# Patient Record
Sex: Female | Born: 1970 | Race: White | Hispanic: No | Smoking: Never smoker
Health system: Southern US, Community
[De-identification: ages and names within clinical notes are randomized; demographics above are authoritative.]

---

## 1998-03-05 ENCOUNTER — Other Ambulatory Visit: Admission: RE | Admit: 1998-03-05 | Discharge: 1998-03-05 | Payer: Self-pay | Admitting: Obstetrics and Gynecology

## 1999-01-14 ENCOUNTER — Emergency Department (HOSPITAL_COMMUNITY): Admission: EM | Admit: 1999-01-14 | Discharge: 1999-01-14 | Payer: Self-pay | Admitting: Emergency Medicine

## 2000-10-02 ENCOUNTER — Emergency Department (HOSPITAL_COMMUNITY): Admission: EM | Admit: 2000-10-02 | Discharge: 2000-10-02 | Payer: Self-pay | Admitting: Emergency Medicine

## 2000-10-02 ENCOUNTER — Encounter: Payer: Self-pay | Admitting: Emergency Medicine

## 2005-02-19 ENCOUNTER — Emergency Department (HOSPITAL_COMMUNITY): Admission: EM | Admit: 2005-02-19 | Discharge: 2005-02-20 | Payer: Self-pay | Admitting: Emergency Medicine

## 2005-02-23 ENCOUNTER — Emergency Department (HOSPITAL_COMMUNITY): Admission: EM | Admit: 2005-02-23 | Discharge: 2005-02-23 | Payer: Self-pay | Admitting: Emergency Medicine

## 2005-05-11 ENCOUNTER — Encounter: Admission: RE | Admit: 2005-05-11 | Discharge: 2005-06-30 | Payer: Self-pay | Admitting: Orthopedic Surgery

## 2007-01-16 ENCOUNTER — Ambulatory Visit (HOSPITAL_COMMUNITY): Admission: RE | Admit: 2007-01-16 | Discharge: 2007-01-16 | Payer: Self-pay | Admitting: Orthopedic Surgery

## 2010-09-27 ENCOUNTER — Emergency Department (HOSPITAL_COMMUNITY)
Admission: EM | Admit: 2010-09-27 | Discharge: 2010-09-28 | Payer: Self-pay | Source: Home / Self Care | Admitting: Emergency Medicine

## 2011-01-03 LAB — BASIC METABOLIC PANEL
Calcium: 9.3 mg/dL (ref 8.4–10.5)
GFR calc Af Amer: >60 mL/min (ref >60)
Potassium: 3.5 mEq/L (ref 3.5–5.1)
Sodium: 139 mEq/L (ref 135–145)

## 2011-01-03 LAB — POCT CARDIAC MARKERS
CKMB, poc: <1 ng/mL — ABNORMAL LOW (ref 1.0–8.0)
Myoglobin, poc: 34.1 ng/mL (ref 12–200)

## 2011-01-03 LAB — DIFFERENTIAL
Eosinophils Relative: 0 % (ref 0–5)
Lymphocytes Relative: 16 % (ref 12–46)
Monocytes Relative: 2 % — ABNORMAL LOW (ref 3–12)
Neutro Abs: 7.3 10*3/uL (ref 1.7–7.7)

## 2011-01-03 LAB — STREP A DNA PROBE

## 2011-01-03 LAB — CBC
Hemoglobin: 12.8 g/dL (ref 12.0–15.0)
MCHC: 35 g/dL (ref 30.0–36.0)
MCV: 90.8 fL (ref 78.0–100.0)
Platelets: 296 10*3/uL (ref 150–400)
RDW: 12.1 % (ref 11.5–15.5)

## 2011-01-03 LAB — URINALYSIS, ROUTINE W REFLEX MICROSCOPIC
Bilirubin Urine: NEGATIVE
Glucose, UA: NEGATIVE mg/dL
Hgb urine dipstick: NEGATIVE
Specific Gravity, Urine: 1.006 (ref 1.005–1.030)
pH: 8.5 — ABNORMAL HIGH (ref 5.0–8.0)

## 2011-01-03 LAB — MONONUCLEOSIS SCREEN: Mono Screen: NEGATIVE

## 2011-01-03 LAB — D-DIMER, QUANTITATIVE: D-Dimer, Quant: 0.53 ug/mL-FEU — ABNORMAL HIGH (ref 0.00–0.48)

## 2011-01-03 LAB — RAPID STREP SCREEN (MED CTR MEBANE ONLY): Streptococcus, Group A Screen (Direct): NEGATIVE

## 2011-09-17 IMAGING — CT CT ANGIO CHEST
2 of 6 series · 19 of 36 positions shown · IV contrast (APPLIED)
Comparison: None

CLINICAL DATA: Shortness of breath

CT ANGIOGRAPHY CHEST WITH CONTRAST
TECHNIQUE: Multidetector CT imaging of the chest was performed
using the standard protocol during bolus administration of
intravenous contrast.  Multiplanar CT image reconstructions
including MIPs were obtained to evaluate the vascular anatomy.
Contrast:  100 ml [id number]

[Series 8: pulm embolism 1.0 b25f thins · axial · 0.59mm/px · z∈[+1174,+1413]mm · 18 of 267 slices shown]
[im 14/267  lung]
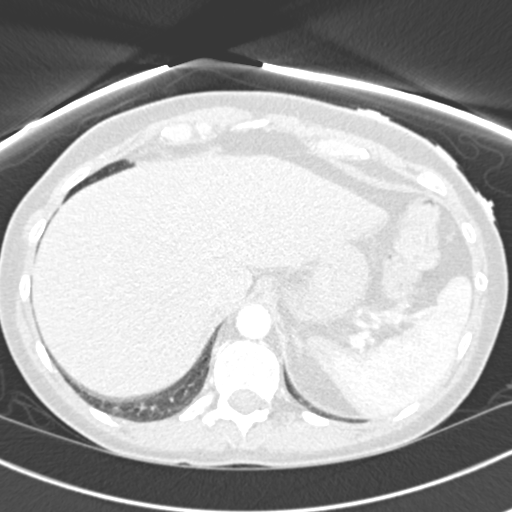
[im 27/267  mediastinal]
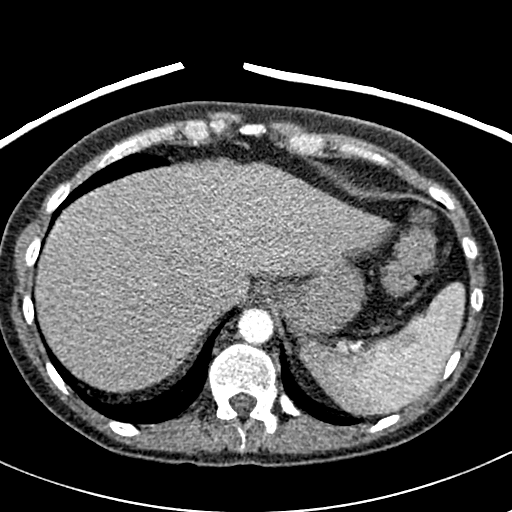
[im 40/267  lung]
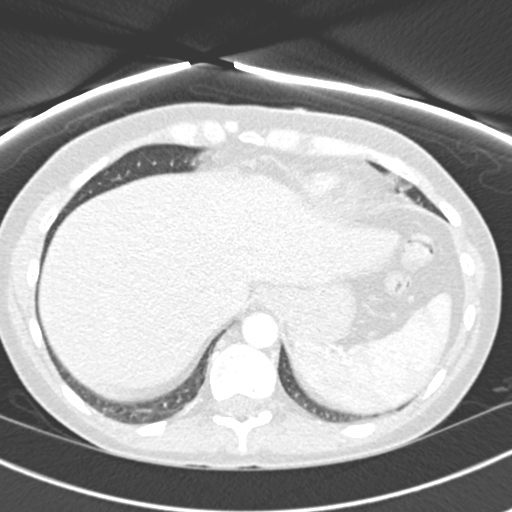
[im 54/267  mediastinal]
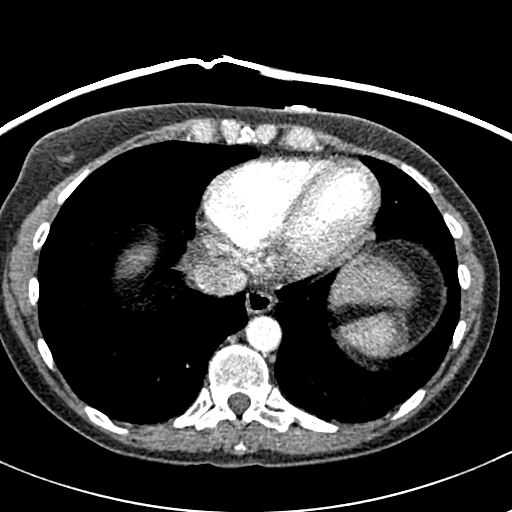
[im 67/267  lung]
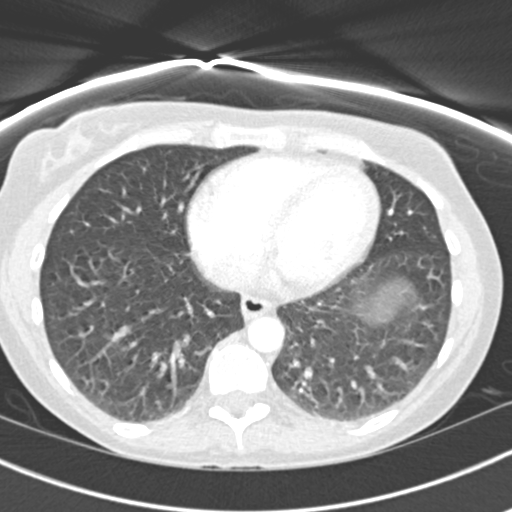
[im 80/267  mediastinal]
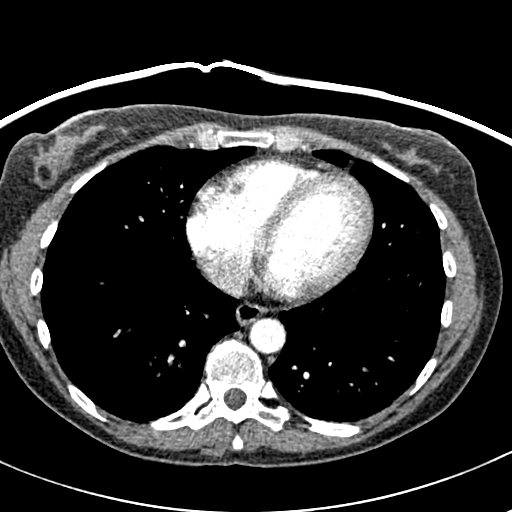
[im 94/267  lung]
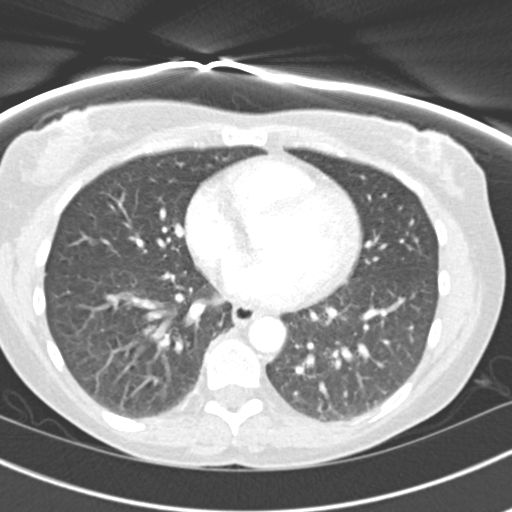
[im 107/267  mediastinal]
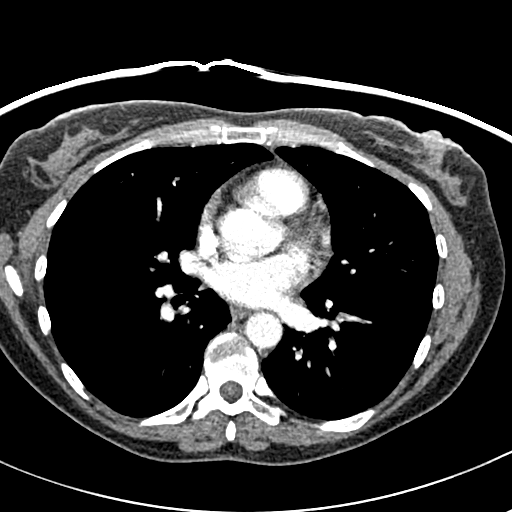
[im 120/267  lung]
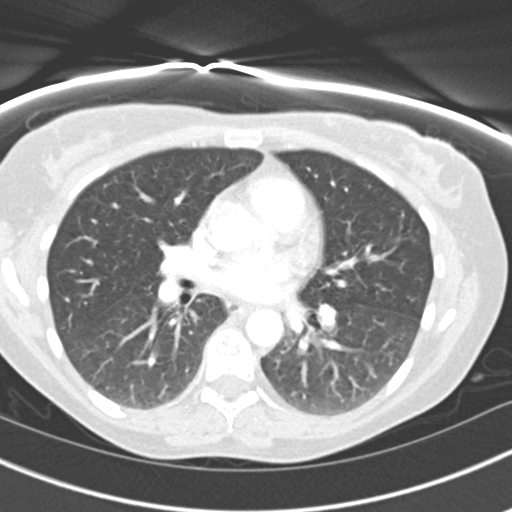
[im 147/267  mediastinal]
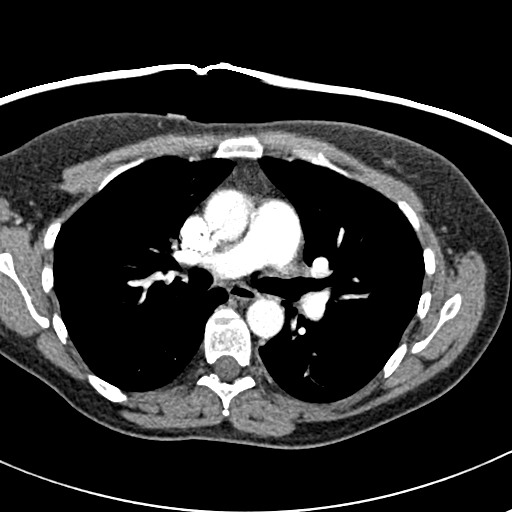
[im 160/267  lung]
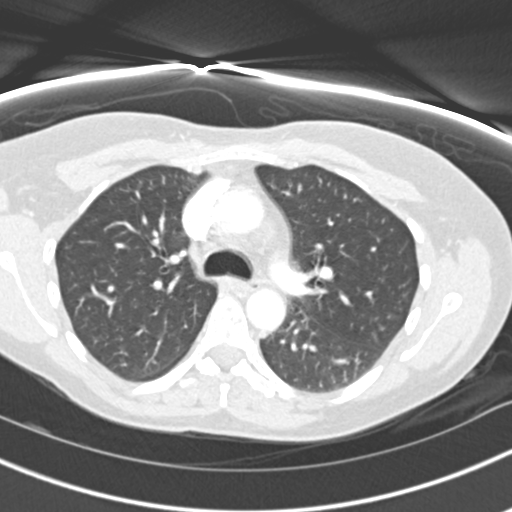
[im 173/267  mediastinal]
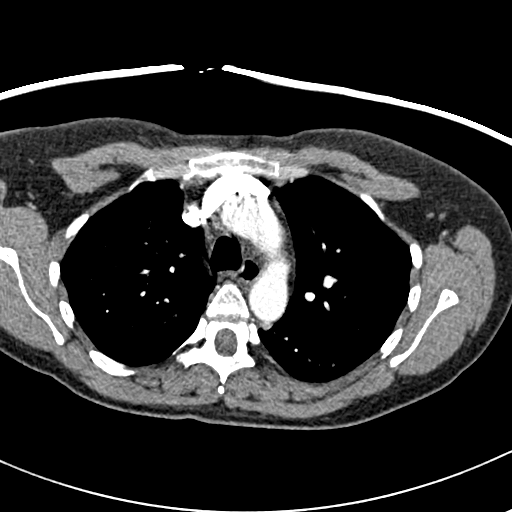
[im 187/267  lung]
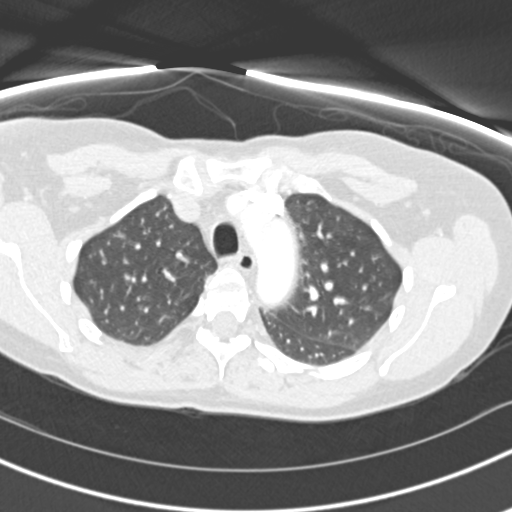
[im 200/267  mediastinal]
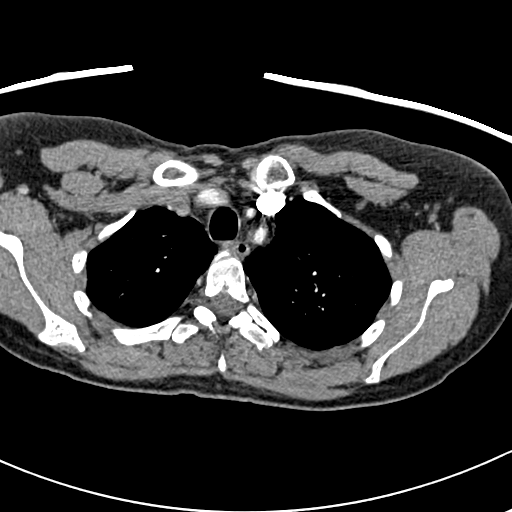
[im 213/267  lung]
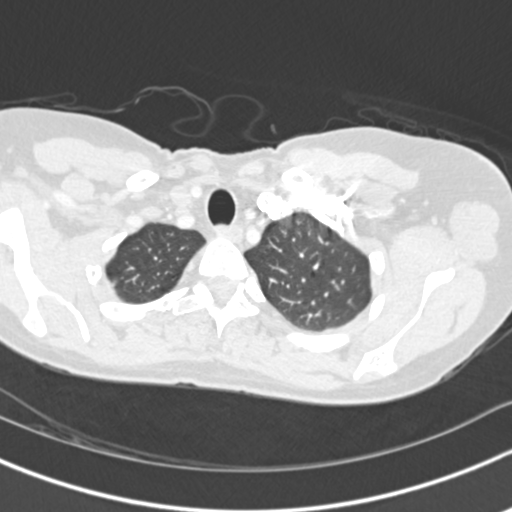
[im 227/267  mediastinal]
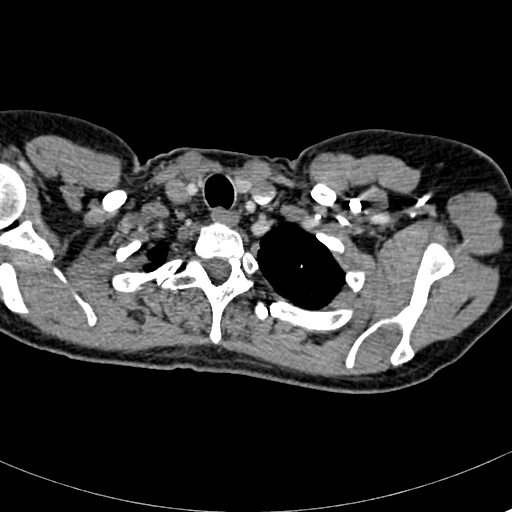
[im 240/267  lung]
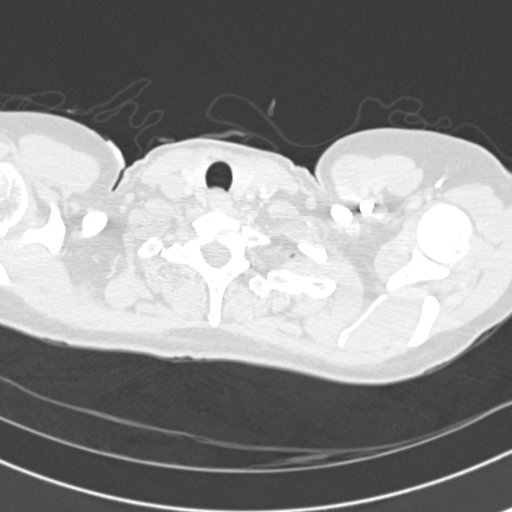
[im 253/267  mediastinal]
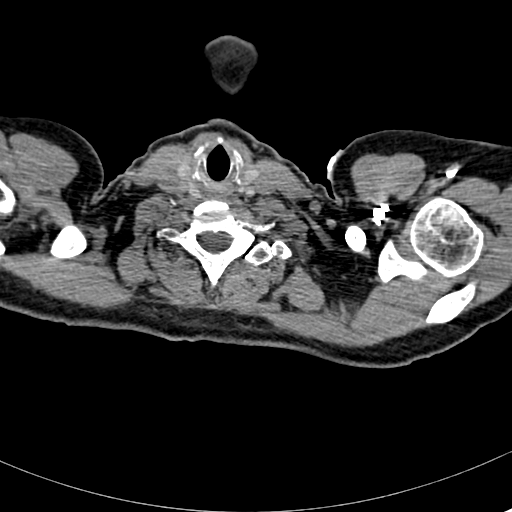

[Series 602: cor · coronal · 0.59mm/px · 1 of 93 slices shown]
[im 47/93  mediastinal]
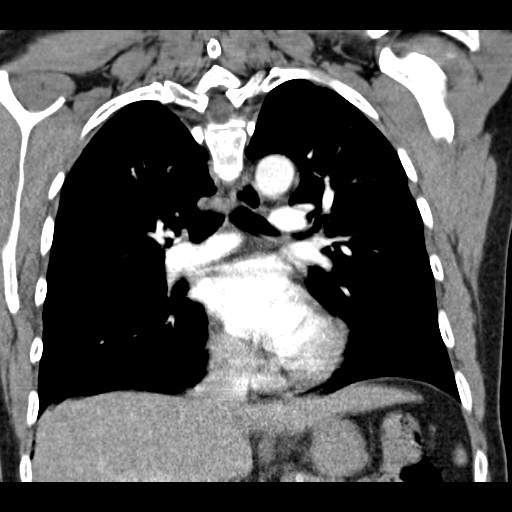

[19 of 36 positions shown; findings below may reference images not displayed]

FINDINGS: The chest wall is unremarkable.  The bony thorax is
intact.

The heart is normal in size.  No pericardial effusion.  No
mediastinal or hilar adenopathy.  The esophagus is grossly normal.
The aorta is normal in caliber.  No dissection.

The pulmonary arterial tree is fairly well opacified.  No definite
filling defects to suggest pulmonary emboli.

Examination of the lung parenchyma demonstrates no acute pulmonary
findings.  No pleural effusions.  No pulmonary mass lesions.

Review of the MIP images confirms the above findings.
IMPRESSION: 1.  No CT findings for pulmonary embolism.
2.  Normal thoracic aorta.
3.  No acute pulmonary findings.

## 2022-07-10 ENCOUNTER — Ambulatory Visit
Admission: RE | Admit: 2022-07-10 | Discharge: 2022-07-10 | Disposition: A | Discharge status: Home / Self Care | Payer: BC Managed Care – PPO | Source: Ambulatory Visit

## 2022-07-10 VITALS — BP 130/82 | HR 79 | Temp 99.1°F | Resp 18 | Ht 66.0 in | Wt 182.0 lb

## 2022-07-10 DIAGNOSIS — J029 Acute pharyngitis, unspecified: Secondary | ICD-10-CM | POA: Diagnosis not present

## 2022-07-10 LAB — POCT RAPID STREP A (OFFICE): Rapid Strep A Screen: NEGATIVE

## 2022-07-10 MED ORDER — PREDNISONE 20 MG PO TABS: ORAL_TABLET | ORAL | 0 refills | Status: AC

## 2022-07-10 MED ORDER — AMOXICILLIN 875 MG PO TABS: 875.0000 mg | ORAL_TABLET | Freq: Two times a day (BID) | ORAL | 0 refills | Status: AC

## 2022-07-10 NOTE — ED Triage Notes (Signed)
Patient states that she tested positive COVID on Saturday.  Patient is c/o severe sore throat.  Did a tele-doc visit and they suggested she be tested for strep throat.  Patient was given lidocaine to help with sore throat.  Patient has taken Nyquil, Dayquil and Mucinex.

## 2022-07-10 NOTE — ED Provider Notes (Signed)
[facility]    CSN: [id number] Arrival date & time: 07/10/22  1340      History   Chief Complaint Chief Complaint  Patient presents with   Sore Throat    Tested positive for COVID on Saturday, video Dr visit by Lorre Munroe on Sunday said I needed to be tested for strep throat - Entered by patient   Appointment    HPI Cynthia Hayes is a 51 y.o. female.   HPI 51 year old female presents with severe sore throat.  Reports was evaluated by TeleDoc on Saturday, 07/08/2022 as she tested positive for COVID-19.  Patient reports [facility] prescribed Paxlovid which she took 1 dose of and stopped due to diarrhea.  Patient request strep test.  History reviewed. No pertinent past medical history.  There are no problems to display for this patient.   History reviewed. No pertinent surgical history.  OB History   No obstetric history on file.      Home Medications    Prior to Admission medications   Medication Sig Start Date End Date Taking? Authorizing Provider  amoxicillin (AMOXIL) 875 MG tablet Take 1 tablet (875 mg total) by mouth 2 (two) times daily for 7 days. 07/10/22 07/17/22 Yes Trevor Iha, FNP  fluticasone (FLONASE) 50 MCG/ACT nasal spray 1 spray by Both Nostrils route daily.   Yes [provider]  levocetirizine (XYZAL) 5 MG tablet Take by mouth. 06/29/20  Yes [provider]  predniSONE (DELTASONE) 20 MG tablet Take 3 tabs PO daily x 5 days. 07/10/22  Yes Trevor Iha, FNP    Family History History reviewed. No pertinent family history.  Social History Social History   Tobacco Use   Smoking status: Never   Smokeless tobacco: Never  Vaping Use   Vaping Use: Never used  Substance Use Topics   Alcohol use: Never   Drug use: Never     Allergies   Aspirin, Azithromycin, and Penicillins   Review of Systems Review of Systems  All other systems reviewed and are negative.    Physical Exam Triage Vital Signs ED Triage Vitals  Enc  Vitals Group     BP 07/10/22 1410 130/82     Pulse Rate 07/10/22 1410 79     Resp 07/10/22 1410 18     Temp 07/10/22 1410 99.1 F (37.3 C)     Temp Source 07/10/22 1410 Oral     SpO2 07/10/22 1410 97 %     Weight 07/10/22 1411 182 lb (82.6 kg)     Height 07/10/22 1411 5\' 6"  (1.676 m)     Head Circumference --      Peak Flow --      Pain Score 07/10/22 1411 10     Pain Loc --      Pain Edu? --      Excl. in GC? --    No data found.  Updated Vital Signs BP 130/82 (BP Location: Right Arm)   Pulse 79   Temp 99.1 F (37.3 C) (Oral)   Resp 18   Ht 5\' 6"  (1.676 m)   Wt 182 lb (82.6 kg)   SpO2 97%   BMI 29.38 kg/m      Physical Exam Vitals and nursing note reviewed.  Constitutional:      General: She is not in acute distress.    Appearance: Normal appearance. She is well-developed. She is obese. She is ill-appearing.  HENT:     Head: Normocephalic and atraumatic.     Right  Ear: Tympanic membrane, ear canal and external ear normal.     Left Ear: Tympanic membrane, ear canal and external ear normal.     Mouth/Throat:     Mouth: Mucous membranes are moist.     Pharynx: Oropharynx is clear. Uvula midline. Posterior oropharyngeal erythema and uvula swelling present.     Tonsils: Tonsillar exudate present. 2+ on the right. 2+ on the left.  Eyes:     Extraocular Movements: Extraocular movements intact.     Conjunctiva/sclera: Conjunctivae normal.     Pupils: Pupils are equal, round, and reactive to light.  Cardiovascular:     Rate and Rhythm: Normal rate and regular rhythm.     Heart sounds: Normal heart sounds.  Pulmonary:     Effort: Pulmonary effort is normal.     Breath sounds: Normal breath sounds. No wheezing, rhonchi or rales.  Musculoskeletal:        General: Normal range of motion.     Cervical back: Normal range of motion and neck supple. Tenderness present.  Lymphadenopathy:     Cervical: No cervical adenopathy.  Skin:    General: Skin is warm and dry.   Neurological:     General: No focal deficit present.     Mental Status: She is alert and oriented to person, place, and time.      UC Treatments / Results  Labs (all labs ordered are listed, but only abnormal results are displayed) Labs Reviewed  POCT RAPID STREP A (OFFICE)    EKG   Radiology No results found.  Procedures Procedures (including critical care time)  Medications Ordered in UC Medications - No data to display  Initial Impression / Assessment and Plan / UC Course  I have reviewed the triage vital signs and the nursing notes.  Pertinent labs & imaging results that were available during my care of the patient were reviewed by me and considered in my medical decision making (see chart for details).     MDM: 1.  Pharyngitis-rapid strep negative Rx'd Amoxicillin, prednisone (patient reports taking amoxicillin in the past without adverse reactions). Advised patient to take medication as directed with food to completion.  Advised patient to take prednisone with first dose of amoxicillin for the next 5 of 7 days. Encouraged patient to increase daily water intake while taking these medications.  Advised patient if symptoms worsen and/or unresolved please follow-up with PCP or here for further evaluation.  Patient discharged home, hemodynamically stable. Final Clinical Impressions(s) / UC Diagnoses   Final diagnoses:  Pharyngitis, unspecified etiology     Discharge Instructions      Advised patient to take medication as directed with food to completion.  Advised patient to take prednisone with first dose of amoxicillin for the next 5 of 7 days. Encouraged patient to increase daily water intake while taking these medications.  Advised patient if symptoms worsen and/or unresolved please follow-up with PCP or here for further evaluation.     ED Prescriptions     Medication Sig Dispense Auth. Provider   amoxicillin (AMOXIL) 875 MG tablet Take 1 tablet (875 mg  total) by mouth 2 (two) times daily for 7 days. 14 tablet Eliezer Lofts, FNP   predniSONE (DELTASONE) 20 MG tablet Take 3 tabs PO daily x 5 days. 15 tablet Eliezer Lofts, FNP      PDMP not reviewed this encounter.   Eliezer Lofts, [place] 07/10/22 1553

## 2022-07-10 NOTE — Discharge Instructions (Addendum)
Advised patient to take medication as directed with food to completion.  Advised patient to take prednisone with first dose of amoxicillin for the next 5 of 7 days. Encouraged patient to increase daily water intake while taking these medications.  Advised patient if symptoms worsen and/or unresolved please follow-up with PCP or here for further evaluation.

## 2022-07-12 ENCOUNTER — Telehealth: Payer: Self-pay

## 2022-07-12 NOTE — Telephone Encounter (Signed)
Pt called for work note due to COVID diagnosis. Work requesting note that see what seen and treated.
